# Patient Record
Sex: Female | Born: 1975 | Race: White | Hispanic: No | Marital: Married | Smoking: Former smoker
Health system: Southern US, Community
[De-identification: ages and names within clinical notes are randomized; demographics above are authoritative.]

## PROBLEM LIST (undated history)

## (undated) DIAGNOSIS — R519 Headache, unspecified: Secondary | ICD-10-CM

## (undated) DIAGNOSIS — R51 Headache: Secondary | ICD-10-CM

## (undated) HISTORY — DX: Headache: R51

## (undated) HISTORY — DX: Headache, unspecified: R51.9

---

## 2011-05-30 LAB — OB RESULTS CONSOLE RPR: RPR: NONREACTIVE

## 2011-05-30 LAB — OB RESULTS CONSOLE HEPATITIS B SURFACE ANTIGEN: Hepatitis B Surface Ag: NEGATIVE

## 2011-05-30 LAB — OB RESULTS CONSOLE RUBELLA ANTIBODY, IGM: Rubella: IMMUNE

## 2011-05-30 LAB — OB RESULTS CONSOLE HIV ANTIBODY (ROUTINE TESTING): HIV: NONREACTIVE

## 2011-12-18 ENCOUNTER — Encounter (HOSPITAL_COMMUNITY): Payer: Self-pay | Admitting: Pharmacy Technician

## 2011-12-19 ENCOUNTER — Encounter (HOSPITAL_COMMUNITY): Payer: Self-pay

## 2011-12-19 ENCOUNTER — Encounter (HOSPITAL_COMMUNITY)
Admission: RE | Admit: 2011-12-19 | Discharge: 2011-12-19 | Disposition: A | Discharge status: Home / Self Care | Payer: Managed Care, Other (non HMO) | Source: Ambulatory Visit | Attending: Obstetrics and Gynecology | Admitting: Obstetrics and Gynecology

## 2011-12-19 LAB — SURGICAL PCR SCREEN
MRSA, PCR: NEGATIVE
Staphylococcus aureus: POSITIVE — AB

## 2011-12-19 LAB — CBC
MCV: 89 fL (ref 78.0–100.0)
Platelets: 125 10*3/uL — ABNORMAL LOW (ref 150–400)
RDW: 14.1 % (ref 11.5–15.5)
WBC: 7.6 10*3/uL (ref 4.0–10.5)

## 2011-12-19 NOTE — Pre-Procedure Instructions (Signed)
Platelet results given to Dr. Cristela Blue, to be repeated on day of C/S per his order.

## 2011-12-19 NOTE — Patient Instructions (Addendum)
[address]  12/19/2011   Your procedure is scheduled on:  12/26/11  Enter through the Main Entrance of [facility] at 6 AM.  Pick up the phone at the desk and dial [phone].   Call this number if you have problems the morning of surgery: [phone]   Remember:   Do not eat food:After Midnight.  Do not drink clear liquids: After Midnight.  Take these medicines the morning of surgery with A SIP OF WATER: NA   Do not wear jewelry, make-up or nail polish.  Do not wear lotions, powders, or perfumes. You may wear deodorant.  Do not shave 48 hours prior to surgery.  Do not bring valuables to the hospital.  Contacts, dentures or bridgework may not be worn into surgery.  Leave suitcase in the car. After surgery it may be brought to your room.  For patients admitted to the hospital, checkout time is 11:00 AM the day of discharge.   Patients discharged the day of surgery will not be allowed to drive home.  Name and phone number of your driver: NA  Special Instructions: CHG Shower Use Special Wash: 1/2 bottle night before surgery and 1/2 bottle morning of surgery.   Please read over the following fact sheets that you were given: MRSA Information

## 2011-12-19 NOTE — H&P (Addendum)
36 yo G3P2 presents for repeat c-section  PMHx: negative PSHx: c-section x 2 All:  None Meds:  PNV Fhx:  N/c SHx:  negative  Af, vss Gen - NAD ABd - gravid, NT CV - RRR LUngs - clear Ext - NT  A/P:  Previous c-section x 2 , for repeat Plan of care discussed, informed consent obtained.

## 2011-12-26 ENCOUNTER — Encounter (HOSPITAL_COMMUNITY)
Admission: RE | Disposition: A | Discharge status: Home / Self Care | Payer: Self-pay | Source: Ambulatory Visit | Attending: Obstetrics and Gynecology

## 2011-12-26 ENCOUNTER — Inpatient Hospital Stay (HOSPITAL_COMMUNITY)
Admission: RE | Admit: 2011-12-26 | Discharge: 2011-12-28 | DRG: 766 | Disposition: A | Discharge status: Home / Self Care | Payer: Managed Care, Other (non HMO) | Source: Ambulatory Visit | Attending: Obstetrics and Gynecology | Admitting: Obstetrics and Gynecology

## 2011-12-26 ENCOUNTER — Encounter (HOSPITAL_COMMUNITY): Payer: Self-pay

## 2011-12-26 ENCOUNTER — Encounter (HOSPITAL_COMMUNITY): Payer: Self-pay | Admitting: Anesthesiology

## 2011-12-26 ENCOUNTER — Inpatient Hospital Stay (HOSPITAL_COMMUNITY): Payer: Managed Care, Other (non HMO) | Admitting: Anesthesiology

## 2011-12-26 DIAGNOSIS — I498 Other specified cardiac arrhythmias: Secondary | ICD-10-CM | POA: Diagnosis not present

## 2011-12-26 DIAGNOSIS — O99893 Other specified diseases and conditions complicating puerperium: Secondary | ICD-10-CM | POA: Diagnosis not present

## 2011-12-26 DIAGNOSIS — O34219 Maternal care for unspecified type scar from previous cesarean delivery: Principal | ICD-10-CM | POA: Diagnosis present

## 2011-12-26 LAB — PREPARE RBC (CROSSMATCH)

## 2011-12-26 LAB — PLATELET COUNT: Platelets: 134 10*3/uL — ABNORMAL LOW (ref 150–400)

## 2011-12-26 LAB — ABO/RH: ABO/RH(D): O POS

## 2011-12-26 SURGERY — Surgical Case
Anesthesia: Spinal | Site: Abdomen | Wound class: Clean Contaminated

## 2011-12-26 MED ORDER — ONDANSETRON HCL 4 MG/2ML IJ SOLN
INTRAMUSCULAR | Status: DC | PRN
  Administered 2011-12-26: 4 mg via INTRAVENOUS

## 2011-12-26 MED ORDER — MORPHINE SULFATE (PF) 0.5 MG/ML IJ SOLN
INTRAMUSCULAR | Status: DC | PRN
  Administered 2011-12-26: .1 mg via INTRATHECAL

## 2011-12-26 MED ORDER — PHENYLEPHRINE HCL 10 MG/ML IJ SOLN
INTRAMUSCULAR | Status: DC | PRN
  Administered 2011-12-26 (×6): 40 ug via INTRAVENOUS

## 2011-12-26 MED ORDER — IBUPROFEN 600 MG PO TABS
600.0000 mg | ORAL_TABLET | Freq: Four times a day (QID) | ORAL | Status: DC
  Administered 2011-12-26 – 2011-12-28 (×8): 600 mg via ORAL
  Filled 2011-12-26 (×8): qty 1

## 2011-12-26 MED ORDER — MEPERIDINE HCL 25 MG/ML IJ SOLN: 6.2500 mg | INTRAMUSCULAR | Status: DC | PRN

## 2011-12-26 MED ORDER — NALBUPHINE HCL 10 MG/ML IJ SOLN
5.0000 mg | INTRAMUSCULAR | Status: DC | PRN
  Filled 2011-12-26: qty 1

## 2011-12-26 MED ORDER — DIPHENHYDRAMINE HCL 25 MG PO CAPS: 25.0000 mg | ORAL_CAPSULE | Freq: Four times a day (QID) | ORAL | Status: DC | PRN

## 2011-12-26 MED ORDER — DIPHENHYDRAMINE HCL 25 MG PO CAPS
25.0000 mg | ORAL_CAPSULE | ORAL | Status: DC | PRN
  Administered 2011-12-26: 25 mg via ORAL
  Filled 2011-12-26 (×2): qty 1

## 2011-12-26 MED ORDER — NALBUPHINE HCL 10 MG/ML IJ SOLN
5.0000 mg | INTRAMUSCULAR | Status: DC | PRN
Start: 2011-12-26 — End: 2011-12-28
  Filled 2011-12-26: qty 1

## 2011-12-26 MED ORDER — OXYTOCIN 10 UNIT/ML IJ SOLN
INTRAMUSCULAR | Status: AC
  Filled 2011-12-26: qty 4

## 2011-12-26 MED ORDER — NALOXONE HCL 0.4 MG/ML IJ SOLN
1.0000 ug/kg/h | INTRAMUSCULAR | Status: DC | PRN
  Filled 2011-12-26: qty 2.5

## 2011-12-26 MED ORDER — SCOPOLAMINE 1 MG/3DAYS TD PT72: 1.0000 | MEDICATED_PATCH | Freq: Once | TRANSDERMAL | Status: DC

## 2011-12-26 MED ORDER — SODIUM CHLORIDE 0.9 % IJ SOLN: 3.0000 mL | INTRAMUSCULAR | Status: DC | PRN

## 2011-12-26 MED ORDER — ONDANSETRON HCL 4 MG PO TABS: 4.0000 mg | ORAL_TABLET | ORAL | Status: DC | PRN

## 2011-12-26 MED ORDER — KETOROLAC TROMETHAMINE 60 MG/2ML IM SOLN
INTRAMUSCULAR | Status: AC
  Filled 2011-12-26: qty 2

## 2011-12-26 MED ORDER — CEFAZOLIN SODIUM-DEXTROSE 2-3 GM-% IV SOLR: 2.0000 g | INTRAVENOUS | Status: DC

## 2011-12-26 MED ORDER — LIDOCAINE IN DEXTROSE 5-7.5 % IV SOLN
INTRAVENOUS | Status: DC | PRN
  Administered 2011-12-26: 70 mg via INTRATHECAL

## 2011-12-26 MED ORDER — PROMETHAZINE HCL 25 MG/ML IJ SOLN: 6.2500 mg | INTRAMUSCULAR | Status: DC | PRN

## 2011-12-26 MED ORDER — MEASLES, MUMPS & RUBELLA VAC ~~LOC~~ INJ
0.5000 mL | INJECTION | Freq: Once | SUBCUTANEOUS | Status: DC
  Filled 2011-12-26: qty 0.5

## 2011-12-26 MED ORDER — SIMETHICONE 80 MG PO CHEW
80.0000 mg | CHEWABLE_TABLET | Freq: Three times a day (TID) | ORAL | Status: DC
  Administered 2011-12-26 – 2011-12-28 (×8): 80 mg via ORAL

## 2011-12-26 MED ORDER — DIBUCAINE 1 % RE OINT: 1.0000 "application " | TOPICAL_OINTMENT | RECTAL | Status: DC | PRN

## 2011-12-26 MED ORDER — FENTANYL CITRATE 0.05 MG/ML IJ SOLN
INTRAMUSCULAR | Status: AC
  Filled 2011-12-26: qty 2

## 2011-12-26 MED ORDER — KETOROLAC TROMETHAMINE 30 MG/ML IJ SOLN: 30.0000 mg | Freq: Four times a day (QID) | INTRAMUSCULAR | Status: AC | PRN

## 2011-12-26 MED ORDER — SCOPOLAMINE 1 MG/3DAYS TD PT72
1.0000 | MEDICATED_PATCH | Freq: Once | TRANSDERMAL | Status: DC
  Administered 2011-12-26: 1.5 mg via TRANSDERMAL

## 2011-12-26 MED ORDER — SCOPOLAMINE 1 MG/3DAYS TD PT72
MEDICATED_PATCH | TRANSDERMAL | Status: AC
  Administered 2011-12-26: 1.5 mg via TRANSDERMAL
  Filled 2011-12-26: qty 1

## 2011-12-26 MED ORDER — DIPHENHYDRAMINE HCL 50 MG/ML IJ SOLN: 12.5000 mg | INTRAMUSCULAR | Status: DC | PRN

## 2011-12-26 MED ORDER — LANOLIN HYDROUS EX OINT: 1.0000 "application " | TOPICAL_OINTMENT | CUTANEOUS | Status: DC | PRN

## 2011-12-26 MED ORDER — METOCLOPRAMIDE HCL 5 MG/ML IJ SOLN: 10.0000 mg | Freq: Three times a day (TID) | INTRAMUSCULAR | Status: DC | PRN

## 2011-12-26 MED ORDER — MEDROXYPROGESTERONE ACETATE 150 MG/ML IM SUSP: 150.0000 mg | INTRAMUSCULAR | Status: DC | PRN

## 2011-12-26 MED ORDER — KETOROLAC TROMETHAMINE 30 MG/ML IJ SOLN: 15.0000 mg | Freq: Once | INTRAMUSCULAR | Status: DC | PRN

## 2011-12-26 MED ORDER — CEFAZOLIN SODIUM-DEXTROSE 2-3 GM-% IV SOLR
INTRAVENOUS | Status: AC
  Administered 2011-12-26: 2 g via INTRAVENOUS
  Filled 2011-12-26: qty 50

## 2011-12-26 MED ORDER — DIPHENHYDRAMINE HCL 50 MG/ML IJ SOLN: 25.0000 mg | INTRAMUSCULAR | Status: DC | PRN

## 2011-12-26 MED ORDER — MENTHOL 3 MG MT LOZG: 1.0000 | LOZENGE | OROMUCOSAL | Status: DC | PRN

## 2011-12-26 MED ORDER — HYDROMORPHONE HCL PF 1 MG/ML IJ SOLN: 0.2500 mg | INTRAMUSCULAR | Status: DC | PRN

## 2011-12-26 MED ORDER — NALOXONE HCL 0.4 MG/ML IJ SOLN: 0.4000 mg | INTRAMUSCULAR | Status: DC | PRN

## 2011-12-26 MED ORDER — ONDANSETRON HCL 4 MG/2ML IJ SOLN: 4.0000 mg | Freq: Three times a day (TID) | INTRAMUSCULAR | Status: DC | PRN

## 2011-12-26 MED ORDER — PHENYLEPHRINE 40 MCG/ML (10ML) SYRINGE FOR IV PUSH (FOR BLOOD PRESSURE SUPPORT)
PREFILLED_SYRINGE | INTRAVENOUS | Status: AC
  Filled 2011-12-26: qty 5

## 2011-12-26 MED ORDER — WITCH HAZEL-GLYCERIN EX PADS: 1.0000 "application " | MEDICATED_PAD | CUTANEOUS | Status: DC | PRN

## 2011-12-26 MED ORDER — LACTATED RINGERS IV SOLN
INTRAVENOUS | Status: DC
  Administered 2011-12-26 (×3): via INTRAVENOUS

## 2011-12-26 MED ORDER — TETANUS-DIPHTH-ACELL PERTUSSIS 5-2.5-18.5 LF-MCG/0.5 IM SUSP: 0.5000 mL | Freq: Once | INTRAMUSCULAR | Status: DC

## 2011-12-26 MED ORDER — PRENATAL MULTIVITAMIN CH
1.0000 | ORAL_TABLET | Freq: Every day | ORAL | Status: DC
  Administered 2011-12-27 – 2011-12-28 (×2): 1 via ORAL
  Filled 2011-12-26 (×2): qty 1

## 2011-12-26 MED ORDER — ONDANSETRON HCL 4 MG/2ML IJ SOLN: 4.0000 mg | INTRAMUSCULAR | Status: DC | PRN

## 2011-12-26 MED ORDER — OXYTOCIN 10 UNIT/ML IJ SOLN
40.0000 [IU] | INTRAVENOUS | Status: DC | PRN
  Administered 2011-12-26: 40 [IU] via INTRAVENOUS

## 2011-12-26 MED ORDER — FENTANYL CITRATE 0.05 MG/ML IJ SOLN
INTRAMUSCULAR | Status: DC | PRN
  Administered 2011-12-26: 15 ug via INTRATHECAL

## 2011-12-26 MED ORDER — DEXTROSE IN LACTATED RINGERS 5 % IV SOLN
INTRAVENOUS | Status: DC
  Administered 2011-12-26: 17:00:00 via INTRAVENOUS

## 2011-12-26 MED ORDER — SENNOSIDES-DOCUSATE SODIUM 8.6-50 MG PO TABS
2.0000 | ORAL_TABLET | Freq: Every day | ORAL | Status: DC
  Administered 2011-12-26 – 2011-12-27 (×2): 2 via ORAL

## 2011-12-26 MED ORDER — OXYTOCIN 40 UNITS IN LACTATED RINGERS INFUSION - SIMPLE MED: 62.5000 mL/h | INTRAVENOUS | Status: AC

## 2011-12-26 MED ORDER — MORPHINE SULFATE 0.5 MG/ML IJ SOLN
INTRAMUSCULAR | Status: AC
  Filled 2011-12-26: qty 10

## 2011-12-26 MED ORDER — ONDANSETRON HCL 4 MG/2ML IJ SOLN
INTRAMUSCULAR | Status: AC
  Filled 2011-12-26: qty 2

## 2011-12-26 MED ORDER — OXYCODONE-ACETAMINOPHEN 5-325 MG PO TABS
1.0000 | ORAL_TABLET | ORAL | Status: DC | PRN
  Administered 2011-12-27 – 2011-12-28 (×4): 1 via ORAL
  Filled 2011-12-26 (×4): qty 1

## 2011-12-26 MED ORDER — SIMETHICONE 80 MG PO CHEW: 80.0000 mg | CHEWABLE_TABLET | ORAL | Status: DC | PRN

## 2011-12-26 MED ORDER — KETOROLAC TROMETHAMINE 60 MG/2ML IM SOLN
60.0000 mg | Freq: Once | INTRAMUSCULAR | Status: AC | PRN
  Administered 2011-12-26: 60 mg via INTRAMUSCULAR

## 2011-12-26 SURGICAL SUPPLY — 29 items
CHLORAPREP W/TINT 26ML (MISCELLANEOUS) ×2 IMPLANT
CLOTH BEACON ORANGE TIMEOUT ST (SAFETY) ×2 IMPLANT
DERMABOND ADVANCED (GAUZE/BANDAGES/DRESSINGS) ×1
DERMABOND ADVANCED .7 DNX12 (GAUZE/BANDAGES/DRESSINGS) ×1 IMPLANT
DRSG COVADERM 4X10 (GAUZE/BANDAGES/DRESSINGS) ×2 IMPLANT
ELECT REM PT RETURN 9FT ADLT (ELECTROSURGICAL) ×2
ELECTRODE REM PT RTRN 9FT ADLT (ELECTROSURGICAL) ×1 IMPLANT
EXTRACTOR VACUUM M CUP 4 TUBE (SUCTIONS) IMPLANT
GLOVE BIO SURGEON STRL SZ 6.5 (GLOVE) ×2 IMPLANT
GLOVE BIOGEL PI IND STRL 7.0 (GLOVE) ×2 IMPLANT
GLOVE BIOGEL PI INDICATOR 7.0 (GLOVE) ×2
GOWN PREVENTION PLUS LG XLONG (DISPOSABLE) ×6 IMPLANT
GOWN STRL REIN XL XLG (GOWN DISPOSABLE) ×2 IMPLANT
KIT ABG SYR 3ML LUER SLIP (SYRINGE) IMPLANT
NEEDLE HYPO 25X5/8 SAFETYGLIDE (NEEDLE) ×2 IMPLANT
NS IRRIG 1000ML POUR BTL (IV SOLUTION) ×2 IMPLANT
PACK C SECTION WH (CUSTOM PROCEDURE TRAY) ×2 IMPLANT
PAD OB MATERNITY 4.3X12.25 (PERSONAL CARE ITEMS) ×2 IMPLANT
SLEEVE SCD COMPRESS KNEE MED (MISCELLANEOUS) IMPLANT
STAPLER VISISTAT 35W (STAPLE) ×2 IMPLANT
SUT CHROMIC 0 CT 802H (SUTURE) IMPLANT
SUT CHROMIC 0 CTX 36 (SUTURE) ×6 IMPLANT
SUT MON AB-0 CT1 36 (SUTURE) ×2 IMPLANT
SUT PDS AB 0 CTX 60 (SUTURE) ×2 IMPLANT
SUT PLAIN 0 NONE (SUTURE) ×4 IMPLANT
SUT VIC AB 4-0 KS 27 (SUTURE) ×2 IMPLANT
TOWEL OR 17X24 6PK STRL BLUE (TOWEL DISPOSABLE) ×4 IMPLANT
TRAY FOLEY CATH 14FR (SET/KITS/TRAYS/PACK) ×2 IMPLANT
WATER STERILE IRR 1000ML POUR (IV SOLUTION) ×2 IMPLANT

## 2011-12-26 NOTE — Anesthesia Postprocedure Evaluation (Signed)
Anesthesia Post Note  Patient: Deborah Mitchell  Procedure(s) Performed: Procedure(s) (LRB): CESAREAN SECTION (N/A)  Anesthesia type: Spinal  Patient location: PACU  Post pain: Pain level controlled  Post assessment: Post-op Vital signs reviewed  Last Vitals:  Filed Vitals:   12/26/11 0610  BP: 118/81  Pulse: 73  Temp: 36.7 C  Resp: 16    Post vital signs: Reviewed  Level of consciousness: awake  Complications: No apparent anesthesia complications

## 2011-12-26 NOTE — Progress Notes (Signed)
Pt with bradycardia in the 40s while in pacu, other vss, Dr Rodman Pickle made aware , okay to transfer pt to the floor hr will increase as epidural wears off, pt asymptomatic with bradycardia.

## 2011-12-26 NOTE — Anesthesia Postprocedure Evaluation (Signed)
  Anesthesia Post-op Note  Patient: Deborah Mitchell  Procedure(s) Performed: Procedure(s) (LRB): CESAREAN SECTION (N/A)  Patient Location: PACU and Women's Unit  Anesthesia Type: Spinal  Level of Consciousness: awake, alert  and oriented  Airway and Oxygen Therapy: Patient Spontanous Breathing  Post-op Pain: mild  Post-op Assessment: Patient's Cardiovascular Status Stable, Respiratory Function Stable, No signs of Nausea or vomiting and Pain level controlled  Post-op Vital Signs: stable  Complications: No apparent anesthesia complications

## 2011-12-26 NOTE — Progress Notes (Signed)
Pt arrived to unit at 9:30am with 2 small areas of shadowing on [place] dressing.  At 10:30 island dressing saturated with red blood, not wet to touch, but covered under top layer of material.  Applied ABD pad with hypofix as pressure dressing.  At 12 noon, left half of dressing saturated with red blood with wet area in bottom left corner of hypofix.  VSS.  Pt denies pain.  Moderate amt lochia.  Fundus firm, ML, @U .  Phoned Dr. Renaldo Fiddler with above information.  She instructed RN to reinforce area again and that she would come by soon to check incision.

## 2011-12-26 NOTE — Transfer of Care (Signed)
Immediate Anesthesia Transfer of Care Note  Patient: Deborah Mitchell  Procedure(s) Performed: Procedure(s) (LRB): CESAREAN SECTION (N/A)  Patient Location: PACU  Anesthesia Type: Spinal  Level of Consciousness: awake, alert  and oriented  Airway & Oxygen Therapy: Patient Spontanous Breathing  Post-op Assessment: Report given to PACU RN and Post -op Vital signs reviewed and stable  Post vital signs: stable  Complications: No apparent anesthesia complications

## 2011-12-26 NOTE — Anesthesia Procedure Notes (Signed)
Spinal  Patient location during procedure: OR Start time: 12/26/2011 7:20 AM Staffing Performed by: anesthesiologist  Preanesthetic Checklist Completed: patient identified, site marked, surgical consent, pre-op evaluation, timeout performed, IV checked, risks and benefits discussed and monitors and equipment checked Spinal Block Patient position: sitting Prep: site prepped and draped and DuraPrep Patient monitoring: heart rate, cardiac monitor, continuous pulse ox and blood pressure Approach: midline Location: L3-4 Injection technique: single-shot Needle Needle type: Sprotte  Needle gauge: 24 G Needle length: 9 cm Assessment Sensory level: T4 Additional Notes Clear free flow CSF on first attempt.  No paresthesia.  Patient tolerated procedure well.  Jasmine December, MD

## 2011-12-26 NOTE — Addendum Note (Signed)
Addendum  created 12/26/11 1638 by Marvette Schamp S Sammie Schermerhorn, CRNA   Modules edited:Notes Section    

## 2011-12-26 NOTE — Anesthesia Preprocedure Evaluation (Signed)
Anesthesia Evaluation  Patient identified by MRN, date of birth, ID band Patient awake    Reviewed: Allergy & Precautions, H&P , NPO status , Patient's Chart, lab work & pertinent test results  Airway Mallampati: I TM Distance: >3 FB Neck ROM: full    Dental No notable dental hx.    Pulmonary neg pulmonary ROS,  breath sounds clear to auscultation  Pulmonary exam normal       Cardiovascular negative cardio ROS      Neuro/Psych negative neurological ROS  negative psych ROS   GI/Hepatic negative GI ROS, Neg liver ROS,   Endo/Other  negative endocrine ROS  Renal/GU negative Renal ROS  negative genitourinary   Musculoskeletal negative musculoskeletal ROS (+)   Abdominal Normal abdominal exam  (+)   Peds negative pediatric ROS (+)  Hematology negative hematology ROS (+)   Anesthesia Other Findings   Reproductive/Obstetrics (+) Pregnancy                           Anesthesia Physical Anesthesia Plan  ASA: II  Anesthesia Plan: Spinal   Post-op Pain Management:    Induction:   Airway Management Planned:   Additional Equipment:   Intra-op Plan:   Post-operative Plan:   Informed Consent: I have reviewed the patients History and Physical, chart, labs and discussed the procedure including the risks, benefits and alternatives for the proposed anesthesia with the patient or authorized representative who has indicated his/her understanding and acceptance.     Plan Discussed with: CRNA and Surgeon  Anesthesia Plan Comments:         Anesthesia Quick Evaluation

## 2011-12-26 NOTE — Progress Notes (Signed)
CTSP and evaluate incision/dressing.  Pt has had 2 dressing changes b/c of saturation w/in 1-1.5hrs.  Denies pain, lightheadedness, dizziness.    Upon my eval, dressing is dry (changed 1 hr ago) without shadowing/drainage.  Because of this, decided to leave bandage in place and watch for drainage/saturation.

## 2011-12-27 ENCOUNTER — Encounter (HOSPITAL_COMMUNITY): Payer: Self-pay | Admitting: Obstetrics and Gynecology

## 2011-12-27 LAB — CBC
HCT: 30.6 % — ABNORMAL LOW (ref 36.0–46.0)
MCH: 29.7 pg (ref 26.0–34.0)
MCV: 90 fL (ref 78.0–100.0)
Platelets: 132 10*3/uL — ABNORMAL LOW (ref 150–400)
RBC: 3.4 MIL/uL — ABNORMAL LOW (ref 3.87–5.11)
WBC: 8.6 10*3/uL (ref 4.0–10.5)

## 2011-12-27 LAB — TYPE AND SCREEN: Unit division: 0

## 2011-12-27 NOTE — Progress Notes (Signed)
Ur chart review completed.  

## 2011-12-27 NOTE — Progress Notes (Signed)
Subjective: Postpartum Day 1: Cesarean Delivery Patient reports tolerating PO and no problems voiding.  Baby in NICU  Objective: Vital signs in last 24 hours: Temp:  [97.2 F (36.2 C)-98.4 F (36.9 C)] 98.2 F (36.8 C) (08/21 0555) Pulse Rate:  [43-80] 55  (08/21 0555) Resp:  [14-20] 18  (08/21 0555) BP: (98-124)/(62-74) 108/73 mmHg (08/21 0555) SpO2:  [96 %-100 %] 99 % (08/21 0555) Weight:  [63.504 kg (140 lb)] 63.504 kg (140 lb) (08/20 0845)  Physical Exam:  General: alert and cooperative Lochia: appropriate Uterine Fundus: firm Incision: healing well, dressing removed which was saturated. No active bleeding observed DVT Evaluation: No evidence of DVT seen on physical exam.   Basename 12/27/11 0615  HGB 10.1*  HCT 30.6*    Assessment/Plan: Status post Cesarean section. Doing well postoperatively.  Continue current care.  Ronnika Collett G 12/27/2011, 8:18 AM

## 2011-12-27 NOTE — Op Note (Signed)
Deborah Mitchell, Deborah Mitchell                ACCOUNT NO.:  [id number]  MEDICAL RECORD NO.:  [id number]  LOCATION:  9303                          FACILITY:  [facility]  PHYSICIAN:  Zelphia Cairo, MD    DATE OF BIRTH:  07-14-1975  DATE OF PROCEDURE: DATE OF DISCHARGE:                              OPERATIVE REPORT   PREOPERATIVE DIAGNOSIS: 1. Intrauterine pregnancy at 39+ 1 week. 2. Prior cesarean section x2.  PROCEDURE:  Repeat low-transverse cesarean delivery.  SURGEON:  Zelphia Cairo, MD  BLOOD LOSS:  500 mL.  URINE OUTPUT:  Clear.  ANESTHESIA:  Spinal.  FINDINGS:  Viable female infant.  Normal-appearing pelvic anatomy.  COMPLICATIONS:  None.  CONDITION:  Stable to recovery room.  PROCEDURE:  The patient was taken to the operating room after informed consent was obtained.  She was prepped and draped in sterile fashion and a Foley catheter was inserted sterilely.  Pfannenstiel skin incision was made with a scalpel and extended laterally.  The fascia was incised in the midline and extended with curved Mayo scissors.  Kocher clamps were used to grasp the superior and inferior portion of the fascia.  The fascia was tented upwards and underlying rectus muscles were dissected off using sharp and blunt dissection.  Peritoneum was then identified and tented upwards with 2 hemostats.  The peritoneum was entered sharply with Metzenbaum scissors.  This was extended superiorly and inferiorly with good visualization of the bladder.  Bladder blade was inserted. The bladder flap was created using sharp and blunt dissection and the bladder blade was repositioned.  Uterine incision was made with a scalpel and extended bluntly using my fingers.  Membranes were ruptured for clear fluid.  Fetal vertex was brought to the uterine incision.  The mouth and nose were suctioned. The shoulders and body easily followed with fundal pressure.  The cord was clamped and cut as the infant's mouth and nose were  suctioned and the infant was taken to the awaiting pediatric staff.  The placenta was then manually removed from the uterus.  The uterus was cleared of all clots and debris using a dry lap sponge.  The uterine incision was closed using double-layer closure of 0 chromic.  Once hemostasis was assured, the pelvis was copiously irrigated with saline.  The incision was reinspected and again found to be hemostatic.  The peritoneum was closed with 0 Monocryl.  The fascia was closed with a looped 0 PDS and the skin was initially closed with 4-0 Vicryl. however, due to some puckering.  The sutures were removed and staples were placed.  The patient was taken to the recovery room in stable condition.  Sponge, lap, needle, and instrument counts were correct x2.     Zelphia Cairo, MD     GA/MEDQ  D:  12/26/2011  T:  12/27/2011  Job:  [id number]

## 2011-12-28 MED ORDER — IBUPROFEN 600 MG PO TABS: 600.0000 mg | ORAL_TABLET | Freq: Four times a day (QID) | ORAL | Status: AC

## 2011-12-28 MED ORDER — OXYCODONE-ACETAMINOPHEN 5-325 MG PO TABS: 1.0000 | ORAL_TABLET | ORAL | Status: AC | PRN

## 2011-12-28 NOTE — Progress Notes (Signed)
Pt to nicu for d/c of infant  Teaching complete

## 2011-12-28 NOTE — Clinical Social Work Maternal (Signed)
    LATE ENTRY FROM 12/27/11:  Clinical Social Work Department PSYCHOSOCIAL ASSESSMENT - MATERNAL/CHILD 12/28/2011  Patient:  OZA, OBERLE  Account Number:  [id number]  Admit Date:  12/26/2011  Marjo Bicker Name:   Gavin Pound    Clinical Social Worker:  Andy Gauss   Date/Time:  12/27/2011 01:00 PM  Date Referred:  12/27/2011   Referral source  NICU  NICU     Referred reason  NICU   Other referral source:    I:  FAMILY / HOME ENVIRONMENT Child's legal guardian:  PARENT  Guardian - Name Guardian - Age Guardian - Address  Solis Mangine [address] [place]; [place], [place] [address]  Eustace Pen 34 (same as above)   Other household support members/support persons Name Relationship DOB  [place] DAUGHTER 34yo  Debbe Mounts [phone]   Other support:    II  PSYCHOSOCIAL DATA Information Source:  Patient Interview  Event organiser Employment:   Surveyor, quantity resources:  Media planner If [employer] - [place]:    School / Grade:   Maternity Care Coordinator / Child Services Coordination / Early Interventions:  Cultural issues impacting care:    III  STRENGTHS Strengths  Adequate Resources  Home prepared for Child (including basic supplies)  Supportive family/friends   Strength comment:    IV  RISK FACTORS AND CURRENT PROBLEMS Current Problem:  YES   Risk Factor & Current Problem Patient Issue Family Issue Risk Factor / Current Problem Comment   N N     V  SOCIAL WORK ASSESSMENT Sw met with MOB briefly to offer resources and assess social situation.  MOB appears to be doing well and understanding of NICU admission.  She is hopeful that the NICU admission will be brief.  Pt lives with her spouse and their 2 young daughters.  She has selected Dr. Jenne Pane as the infants pediatrician.  Pt has all the necessary supplies for the infant and appropriate supports in place.  She does not express any needs and this time.  Sw will continue to follow and act  as a resource until infant is discharged.      VI SOCIAL WORK PLAN Social Work Plan  No Further Intervention Required / No Barriers to Discharge   Type of pt/family education:   If child protective services report - county:   If child protective services report - date:   Information/referral to community resources comment:   Other social work plan:

## 2011-12-28 NOTE — Discharge Summary (Signed)
Obstetric Discharge Summary Reason for Admission: cesarean section Prenatal Procedures: ultrasound Intrapartum Procedures: cesarean: low cervical, transverse Postpartum Procedures: none Complications-Operative and Postpartum: none Hemoglobin  Date Value Range Status  12/27/2011 10.1* 12.0 - 15.0 g/dL Final     HCT  Date Value Range Status  12/27/2011 30.6* 36.0 - 46.0 % Final    Physical Exam:  General: alert and cooperative Lochia: appropriate Uterine Fundus: firm Incision: healing well, small ecchymosis noted L margin of incision with small drainage. No evidence of hematoma or seroma, staples left in place, steri-strips applied DVT Evaluation: No evidence of DVT seen on physical exam.  Discharge Diagnoses: Term Pregnancy-delivered  Discharge Information: Date: 12/28/2011 Activity: pelvic rest Diet: routine Medications: PNV, Ibuprofen and Percocet Condition: stable Instructions: refer to practice specific booklet Discharge to: home   Newborn Data: Live born female  Birth Weight: 7 lb 10.1 oz (3460 g) APGAR: 8, 8  Home with NICU with possible discharge home today.  Kiersten Coss G 12/28/2011, 9:23 AM

## 2011-12-28 NOTE — Progress Notes (Signed)
This was a brief visit with Deborah Mitchell and her husband who was pleased to be discharged today along with her baby.  They did not wish to visit further at this time, but seemed to be doing well.    [address] [place] Pager, [phone] 10:42 AM   12/28/11 1000  Clinical Encounter Type  Visited With Patient and family together  Visit Type Initial

## 2011-12-28 NOTE — Progress Notes (Signed)
UR Chart review completed.  

## 2014-03-09 ENCOUNTER — Encounter (HOSPITAL_COMMUNITY): Payer: Self-pay | Admitting: Obstetrics and Gynecology

## 2017-05-21 ENCOUNTER — Encounter: Payer: Self-pay | Admitting: Neurology

## 2017-05-21 ENCOUNTER — Ambulatory Visit (INDEPENDENT_AMBULATORY_CARE_PROVIDER_SITE_OTHER): Payer: BLUE CROSS/BLUE SHIELD | Admitting: Neurology

## 2017-05-21 ENCOUNTER — Encounter (INDEPENDENT_AMBULATORY_CARE_PROVIDER_SITE_OTHER): Payer: Self-pay

## 2017-05-21 VITALS — BP 138/96 | HR 87 | Ht 64.0 in | Wt 126.0 lb

## 2017-05-21 DIAGNOSIS — G4452 New daily persistent headache (NDPH): Secondary | ICD-10-CM

## 2017-05-21 NOTE — Patient Instructions (Signed)
Magnesium oxide 400 mg twice a day Riboflavin= vitamin B12 100 mg twice a day

## 2017-05-21 NOTE — Progress Notes (Signed)
PATIENT: Deborah Mitchell DOB: 08/08/1975  Chief Complaint  Patient presents with  . Headache    Reports having daily headaches since June 2018.  She is using a magnesium supplement but no prescribed medications for her headaches.  She does not medicate the pain.  Marland Kitchen. PCP    Puglisi, Pamala DuffelJanis P, FNP     HISTORICAL  Deborah Mitchell is a 42 year old female, seen in refer by her primary care nurse practitioner Avel SensorPuglisi, Janis P, for evaluation of frequent headache, initial evaluation was on May 21, 2017.  I reviewed and summarized the referring note, she was previously healthy, deny a family history of previous history of migraine headache,   Since June 2018, she began to complains of frequent headaches, left frontal temporal region mild tension headache, she only occasionally take Advil, but usually do not require medication, she denies pounding, no visual loss, no lateralized motor or sensory deficit.  She has occasionally transient sharp pain through the left temporoparietal region,   She complains of almost constant left-sided discomfort, pressure sensation, was seen by ophthalmologist recently that was normal   REVIEW OF SYSTEMS: Full 14 system review of systems performed and notable only for eye pain, persistent headaches  ALLERGIES: No Known Allergies  HOME MEDICATIONS: Current Outpatient Medications  Medication Sig Dispense Refill  . COLLAGEN PO Take 6,000 mg by mouth daily.    . Multiple Vitamins-Minerals (WOMENS MULTIVITAMIN PO) Take 1 tablet by mouth 2 (two) times daily.    . Norethindrone-Ethinyl Estradiol-Fe Biphas (LO LOESTRIN FE) 1 MG-10 MCG / 10 MCG tablet Take 1 tablet by mouth daily.    Marland Kitchen. OLIVE LEAF PO Take by mouth daily. Takes 1-2 tablets daily.    Marland Kitchen. UNABLE TO FIND Take 2 tablets by mouth daily. Cal (1000mg )-Mag (500mg )-Zinc (25mg ) with Vitamin D3 (200 IU)     No current facility-administered medications for this visit.     PAST MEDICAL HISTORY: Past Medical  History:  Diagnosis Date  . Headache     PAST SURGICAL HISTORY: Past Surgical History:  Procedure Laterality Date  . CESAREAN SECTION  2010,2011   x2  . CESAREAN SECTION  12/26/2011   Procedure: CESAREAN SECTION;  Surgeon: Zelphia CairoGretchen Adkins, MD;  Location: [facility];  Service: Obstetrics;  Laterality: N/A;  repeat edc 01/01/12    FAMILY HISTORY: Family History  Problem Relation Age of Onset  . Healthy Mother   . Hypertension Father   . Hyperlipidemia Father   . Alzheimer's disease Paternal Grandfather     SOCIAL HISTORY:  Social History   Socioeconomic History  . Marital status: Married    Spouse name: Not on file  . Number of children: 3  . Years of education: 16  . Highest education level: Master's degree (e.g., MA, MS, MEng, MEd, MSW, MBA)  Social Needs  . Financial resource strain: Not on file  . Food insecurity - worry: Not on file  . Food insecurity - inability: Not on file  . Transportation needs - medical: Not on file  . Transportation needs - non-medical: Not on file  Occupational History  . Occupation: Homemaker  Tobacco Use  . Smoking status: Former Smoker    Types: Cigarettes  . Smokeless tobacco: Never Used  Substance and Sexual Activity  . Alcohol use: Yes    Comment: Social  . Drug use: No  . Sexual activity: Not on file  Other Topics Concern  . Not on file  Social History Narrative   Lives at home with  husband and three children.   Left-handed.   0.5 cup caffeine daily.     PHYSICAL EXAM   Vitals:   05/21/17 1300  BP: (!) 138/96  Pulse: 87  Weight: 126 lb (57.2 kg)  Height: 5\' 4"  (1.626 m)    Not recorded      Body mass index is 21.63 kg/m.  PHYSICAL EXAMNIATION:  Gen: NAD, conversant, well nourised, obese, well groomed                     Cardiovascular: Regular rate rhythm, no peripheral edema, warm, nontender. Eyes: Conjunctivae clear without exudates or hemorrhage Neck: Supple, no carotid bruits. Pulmonary: Clear to  auscultation bilaterally   NEUROLOGICAL EXAM:  MENTAL STATUS: Speech:    Speech is normal; fluent and spontaneous with normal comprehension.  Cognition:     Orientation to time, place and person     Normal recent and remote memory     Normal Attention span and concentration     Normal Language, naming, repeating,spontaneous speech     Fund of knowledge   CRANIAL NERVES: CN II: Visual fields are full to confrontation. Fundoscopic exam is normal with sharp discs and no vascular changes. Pupils are round equal and briskly reactive to light. CN III, IV, VI: extraocular movement are normal. No ptosis. CN V: Facial sensation is intact to pinprick in all 3 divisions bilaterally. Corneal responses are intact.  CN VII: Face is symmetric with normal eye closure and smile. CN VIII: Hearing is normal to rubbing fingers CN IX, X: Palate elevates symmetrically. Phonation is normal. CN XI: Head turning and shoulder shrug are intact CN XII: Tongue is midline with normal movements and no atrophy.  MOTOR: There is no pronator drift of out-stretched arms. Muscle bulk and tone are normal. Muscle strength is normal.  REFLEXES: Reflexes are 2+ and symmetric at the biceps, triceps, knees, and ankles. Plantar responses are flexor.  SENSORY: Intact to light touch, pinprick, positional sensation and vibratory sensation are intact in fingers and toes.  COORDINATION: Rapid alternating movements and fine finger movements are intact. There is no dysmetria on finger-to-nose and heel-knee-shin.    GAIT/STANCE: Posture is normal. Gait is steady with normal steps, base, arm swing, and turning. Heel and toe walking are normal. Tandem gait is normal.  Romberg is absent.   DIAGNOSTIC DATA (LABS, IMAGING, TESTING) - I reviewed patient records, labs, notes, testing and imaging myself where available.   ASSESSMENT AND PLAN  Deborah Mitchell is a 42 y.o. female   Frequent left side headache  Has migraine  features  I have discussed with patient, continue observe her symptoms, if her symptoms persist, getting worse, we can proceed with MRI of the brain  NSAIDs as needed for moderate headaches for now   Levert Feinstein, M.D. Ph.D.  [facility] [address], [address] [place], [place] [address] Ph: [phone] Fax: [phone]  CC: Avel Sensor, FNP

## 2019-02-10 ENCOUNTER — Other Ambulatory Visit: Payer: Self-pay

## 2019-02-10 DIAGNOSIS — Z20822 Contact with and (suspected) exposure to covid-19: Secondary | ICD-10-CM

## 2019-02-12 LAB — NOVEL CORONAVIRUS, NAA: SARS-CoV-2, NAA: NOT DETECTED

## 2019-03-28 ENCOUNTER — Other Ambulatory Visit: Payer: Self-pay

## 2019-03-28 DIAGNOSIS — Z20822 Contact with and (suspected) exposure to covid-19: Secondary | ICD-10-CM

## 2019-03-31 LAB — NOVEL CORONAVIRUS, NAA: SARS-CoV-2, NAA: NOT DETECTED

## 2020-01-23 DIAGNOSIS — Z01419 Encounter for gynecological examination (general) (routine) without abnormal findings: Secondary | ICD-10-CM | POA: Diagnosis not present

## 2020-01-23 DIAGNOSIS — Z6821 Body mass index (BMI) 21.0-21.9, adult: Secondary | ICD-10-CM | POA: Diagnosis not present

## 2020-01-23 DIAGNOSIS — Z1231 Encounter for screening mammogram for malignant neoplasm of breast: Secondary | ICD-10-CM | POA: Diagnosis not present

## 2020-01-28 ENCOUNTER — Other Ambulatory Visit: Payer: Self-pay | Admitting: Obstetrics and Gynecology

## 2020-01-28 DIAGNOSIS — R928 Other abnormal and inconclusive findings on diagnostic imaging of breast: Secondary | ICD-10-CM

## 2020-01-30 DIAGNOSIS — Z1321 Encounter for screening for nutritional disorder: Secondary | ICD-10-CM | POA: Diagnosis not present

## 2020-01-30 DIAGNOSIS — Z1329 Encounter for screening for other suspected endocrine disorder: Secondary | ICD-10-CM | POA: Diagnosis not present

## 2020-01-30 DIAGNOSIS — Z13220 Encounter for screening for lipoid disorders: Secondary | ICD-10-CM | POA: Diagnosis not present

## 2020-01-30 DIAGNOSIS — Z131 Encounter for screening for diabetes mellitus: Secondary | ICD-10-CM | POA: Diagnosis not present

## 2020-02-11 ENCOUNTER — Other Ambulatory Visit: Payer: Self-pay | Admitting: Obstetrics and Gynecology

## 2020-02-11 ENCOUNTER — Ambulatory Visit
Admission: RE | Admit: 2020-02-11 | Discharge: 2020-02-11 | Disposition: A | Discharge status: Home / Self Care | Payer: BC Managed Care – PPO | Source: Ambulatory Visit | Attending: Obstetrics and Gynecology | Admitting: Obstetrics and Gynecology

## 2020-02-11 ENCOUNTER — Other Ambulatory Visit: Payer: Self-pay

## 2020-02-11 DIAGNOSIS — N6489 Other specified disorders of breast: Secondary | ICD-10-CM

## 2020-02-11 DIAGNOSIS — R928 Other abnormal and inconclusive findings on diagnostic imaging of breast: Secondary | ICD-10-CM

## 2020-03-18 DIAGNOSIS — Z23 Encounter for immunization: Secondary | ICD-10-CM | POA: Diagnosis not present

## 2020-04-27 DIAGNOSIS — Z20828 Contact with and (suspected) exposure to other viral communicable diseases: Secondary | ICD-10-CM | POA: Diagnosis not present

## 2020-08-12 ENCOUNTER — Other Ambulatory Visit: Payer: BC Managed Care – PPO

## 2020-08-27 ENCOUNTER — Ambulatory Visit
Admission: RE | Admit: 2020-08-27 | Discharge: 2020-08-27 | Disposition: A | Discharge status: Home / Self Care | Payer: BC Managed Care – PPO | Source: Ambulatory Visit | Attending: Obstetrics and Gynecology | Admitting: Obstetrics and Gynecology

## 2020-08-27 ENCOUNTER — Other Ambulatory Visit: Payer: Self-pay | Admitting: Obstetrics and Gynecology

## 2020-08-27 ENCOUNTER — Other Ambulatory Visit: Payer: Self-pay

## 2020-08-27 DIAGNOSIS — R922 Inconclusive mammogram: Secondary | ICD-10-CM | POA: Diagnosis not present

## 2020-08-27 DIAGNOSIS — N6012 Diffuse cystic mastopathy of left breast: Secondary | ICD-10-CM | POA: Diagnosis not present

## 2020-08-27 DIAGNOSIS — R928 Other abnormal and inconclusive findings on diagnostic imaging of breast: Secondary | ICD-10-CM

## 2020-08-27 DIAGNOSIS — N6489 Other specified disorders of breast: Secondary | ICD-10-CM

## 2021-01-18 DIAGNOSIS — D1801 Hemangioma of skin and subcutaneous tissue: Secondary | ICD-10-CM | POA: Diagnosis not present

## 2021-01-18 DIAGNOSIS — D225 Melanocytic nevi of trunk: Secondary | ICD-10-CM | POA: Diagnosis not present

## 2021-01-18 DIAGNOSIS — L905 Scar conditions and fibrosis of skin: Secondary | ICD-10-CM | POA: Diagnosis not present

## 2021-01-27 ENCOUNTER — Other Ambulatory Visit: Payer: Self-pay | Admitting: Internal Medicine

## 2021-01-27 ENCOUNTER — Ambulatory Visit
Admission: RE | Admit: 2021-01-27 | Discharge: 2021-01-27 | Disposition: A | Discharge status: Home / Self Care | Payer: BC Managed Care – PPO | Source: Ambulatory Visit | Attending: Internal Medicine | Admitting: Internal Medicine

## 2021-01-27 DIAGNOSIS — M546 Pain in thoracic spine: Secondary | ICD-10-CM | POA: Diagnosis not present

## 2021-01-27 DIAGNOSIS — M62830 Muscle spasm of back: Secondary | ICD-10-CM

## 2021-01-27 DIAGNOSIS — E559 Vitamin D deficiency, unspecified: Secondary | ICD-10-CM | POA: Diagnosis not present

## 2021-01-27 DIAGNOSIS — R42 Dizziness and giddiness: Secondary | ICD-10-CM | POA: Diagnosis not present

## 2021-01-27 DIAGNOSIS — R634 Abnormal weight loss: Secondary | ICD-10-CM | POA: Diagnosis not present

## 2021-01-27 DIAGNOSIS — E611 Iron deficiency: Secondary | ICD-10-CM | POA: Diagnosis not present

## 2021-01-31 ENCOUNTER — Other Ambulatory Visit: Payer: Self-pay | Admitting: Obstetrics and Gynecology

## 2021-01-31 DIAGNOSIS — N6002 Solitary cyst of left breast: Secondary | ICD-10-CM

## 2021-02-03 ENCOUNTER — Other Ambulatory Visit: Payer: Self-pay

## 2021-02-03 ENCOUNTER — Ambulatory Visit
Admission: RE | Admit: 2021-02-03 | Discharge: 2021-02-03 | Disposition: A | Discharge status: Home / Self Care | Payer: BC Managed Care – PPO | Source: Ambulatory Visit | Attending: Obstetrics and Gynecology | Admitting: Obstetrics and Gynecology

## 2021-02-03 DIAGNOSIS — R922 Inconclusive mammogram: Secondary | ICD-10-CM | POA: Diagnosis not present

## 2021-02-03 DIAGNOSIS — N6002 Solitary cyst of left breast: Secondary | ICD-10-CM

## 2021-07-08 DIAGNOSIS — E559 Vitamin D deficiency, unspecified: Secondary | ICD-10-CM | POA: Diagnosis not present

## 2021-07-08 DIAGNOSIS — Z13220 Encounter for screening for lipoid disorders: Secondary | ICD-10-CM | POA: Diagnosis not present

## 2021-07-08 DIAGNOSIS — Z0000 Encounter for general adult medical examination without abnormal findings: Secondary | ICD-10-CM | POA: Diagnosis not present

## 2021-09-15 DIAGNOSIS — Z01419 Encounter for gynecological examination (general) (routine) without abnormal findings: Secondary | ICD-10-CM | POA: Diagnosis not present

## 2021-09-15 DIAGNOSIS — Z681 Body mass index (BMI) 19 or less, adult: Secondary | ICD-10-CM | POA: Diagnosis not present

## 2021-09-15 DIAGNOSIS — Z124 Encounter for screening for malignant neoplasm of cervix: Secondary | ICD-10-CM | POA: Diagnosis not present

## 2021-09-15 DIAGNOSIS — Z1151 Encounter for screening for human papillomavirus (HPV): Secondary | ICD-10-CM | POA: Diagnosis not present

## 2021-12-28 ENCOUNTER — Other Ambulatory Visit: Payer: Self-pay | Admitting: Obstetrics and Gynecology

## 2021-12-28 DIAGNOSIS — R928 Other abnormal and inconclusive findings on diagnostic imaging of breast: Secondary | ICD-10-CM

## 2022-02-15 ENCOUNTER — Ambulatory Visit
Admission: RE | Admit: 2022-02-15 | Discharge: 2022-02-15 | Disposition: A | Discharge status: Home / Self Care | Payer: BC Managed Care – PPO | Source: Ambulatory Visit | Attending: Obstetrics and Gynecology | Admitting: Obstetrics and Gynecology

## 2022-02-15 ENCOUNTER — Other Ambulatory Visit: Payer: Self-pay | Admitting: Obstetrics and Gynecology

## 2022-02-15 DIAGNOSIS — R928 Other abnormal and inconclusive findings on diagnostic imaging of breast: Secondary | ICD-10-CM

## 2022-02-15 DIAGNOSIS — R922 Inconclusive mammogram: Secondary | ICD-10-CM | POA: Diagnosis not present

## 2022-02-15 DIAGNOSIS — N6489 Other specified disorders of breast: Secondary | ICD-10-CM | POA: Diagnosis not present

## 2022-10-17 DIAGNOSIS — Z1151 Encounter for screening for human papillomavirus (HPV): Secondary | ICD-10-CM | POA: Diagnosis not present

## 2022-10-17 DIAGNOSIS — Z124 Encounter for screening for malignant neoplasm of cervix: Secondary | ICD-10-CM | POA: Diagnosis not present

## 2022-10-17 DIAGNOSIS — Z6820 Body mass index (BMI) 20.0-20.9, adult: Secondary | ICD-10-CM | POA: Diagnosis not present

## 2022-10-17 DIAGNOSIS — Z01419 Encounter for gynecological examination (general) (routine) without abnormal findings: Secondary | ICD-10-CM | POA: Diagnosis not present

## 2022-11-27 ENCOUNTER — Encounter: Payer: Self-pay | Admitting: Gastroenterology

## 2022-12-14 ENCOUNTER — Ambulatory Visit (AMBULATORY_SURGERY_CENTER): Payer: BC Managed Care – PPO

## 2022-12-14 VITALS — Ht 64.0 in | Wt 122.0 lb

## 2022-12-14 DIAGNOSIS — Z1211 Encounter for screening for malignant neoplasm of colon: Secondary | ICD-10-CM

## 2022-12-14 MED ORDER — NA SULFATE-K SULFATE-MG SULF 17.5-3.13-1.6 GM/177ML PO SOLN: 1.0000 | Freq: Once | ORAL | 0 refills | Status: AC

## 2022-12-14 NOTE — Progress Notes (Signed)

## 2022-12-29 ENCOUNTER — Encounter: Payer: Self-pay | Admitting: Gastroenterology

## 2022-12-29 ENCOUNTER — Telehealth: Payer: Self-pay | Admitting: Gastroenterology

## 2022-12-29 MED ORDER — NA SULFATE-K SULFATE-MG SULF 17.5-3.13-1.6 GM/177ML PO SOLN: 1.0000 | Freq: Once | ORAL | 0 refills | Status: AC

## 2022-12-29 NOTE — Telephone Encounter (Signed)
Inbound call from patient requesting prep medication be sent to [facility] on [address] Please advise.

## 2022-12-29 NOTE — Telephone Encounter (Signed)
Called and spoke with patient- she reports she has been trying to have her prep RX transferred to [facility] but is unable to speak with anyone on the phone at the pharmacy-  Previous RX cancelled and new Rx sent to [facility] per patient request; Patient advised to call back to the office at [phone] should questions/concerns arise; Patient verbalized understanding of information/instructions;

## 2023-01-12 ENCOUNTER — Ambulatory Visit (AMBULATORY_SURGERY_CENTER): Payer: BC Managed Care – PPO | Admitting: Gastroenterology

## 2023-01-12 ENCOUNTER — Encounter: Payer: Self-pay | Admitting: Gastroenterology

## 2023-01-12 VITALS — BP 115/76 | HR 56 | Temp 99.1°F | Resp 11 | Ht 64.0 in | Wt 122.0 lb

## 2023-01-12 DIAGNOSIS — Z1211 Encounter for screening for malignant neoplasm of colon: Secondary | ICD-10-CM

## 2023-01-12 MED ORDER — SODIUM CHLORIDE 0.9 % IV SOLN: 500.0000 mL | Freq: Once | INTRAVENOUS | Status: DC

## 2023-01-12 NOTE — Op Note (Signed)
[facility] Patient Name: Deborah Mitchell Procedure Date: 01/12/2023 2:09 PM MRN: [id number] Endoscopist: Napoleon Form , MD, [id number] Age: 47 Referring MD:  Date of Birth: 19-Apr-1976 Gender: Female Account #: [id number] Procedure:                Colonoscopy Indications:              Screening for colorectal malignant neoplasm Medicines:                Monitored Anesthesia Care Procedure:                Pre-Anesthesia Assessment:                           - Prior to the procedure, a History and Physical                            was performed, and patient medications and                            allergies were reviewed. The patient's tolerance of                            previous anesthesia was also reviewed. The risks                            and benefits of the procedure and the sedation                            options and risks were discussed with the patient.                            All questions were answered, and informed consent                            was obtained. Prior Anticoagulants: The patient has                            taken no anticoagulant or antiplatelet agents. ASA                            Grade Assessment: I - A normal, healthy patient.                            After reviewing the risks and benefits, the patient                            was deemed in satisfactory condition to undergo the                            procedure.                           After obtaining informed consent, the colonoscope  was passed under direct vision. Throughout the                            procedure, the patient's blood pressure, pulse, and                            oxygen saturations were monitored continuously. The                            Olympus Scope SN: [id number] was introduced through                            the anus and advanced to the the cecum, identified                            by appendiceal orifice  and ileocecal valve. The                            colonoscopy was performed without difficulty. The                            patient tolerated the procedure well. The quality                            of the bowel preparation was good. The ileocecal                            valve, appendiceal orifice, and rectum were                            photographed. Scope In: 2:20:09 PM Scope Out: 2:37:06 PM Scope Withdrawal Time: 0 hours 9 minutes 21 seconds  Total Procedure Duration: 0 hours 16 minutes 57 seconds  Findings:                 The perianal and digital rectal examinations were                            normal.                           A few small-mouthed diverticula were found in the                            sigmoid colon.                           Non-bleeding external and internal hemorrhoids were                            found during retroflexion. The hemorrhoids were                            small. Complications:            No immediate complications. Estimated Blood Loss:  Estimated blood loss: none. Impression:               - Diverticulosis in the sigmoid colon.                           - Non-bleeding external and internal hemorrhoids.                           - No specimens collected. Recommendation:           - Patient has a contact number available for                            emergencies. The signs and symptoms of potential                            delayed complications were discussed with the                            patient. Return to normal activities tomorrow.                            Written discharge instructions were provided to the                            patient.                           - Resume previous diet.                           - Continue present medications.                           - Repeat colonoscopy in 10 years for surveillance. Napoleon Form, MD 01/12/2023 2:47:41 PM This report has been signed  electronically.

## 2023-01-12 NOTE — Progress Notes (Signed)
Report to PACU, RN, vss, BBS= Clear.  

## 2023-01-12 NOTE — Progress Notes (Unsigned)
[facility] History and Physical   Primary Care Physician:  Zelphia Cairo, MD   Reason for Procedure:  Colorectal cancer screening  Plan:    Screening colonoscopy with possible interventions as needed     HPI: Deborah Mitchell is a very pleasant 47 y.o. female here for screening colonoscopy. Denies any nausea, vomiting, abdominal pain, melena or bright red blood per rectum  The risks and benefits as well as alternatives of endoscopic procedure(s) have been discussed and reviewed. All questions answered. The patient agrees to proceed.    Past Medical History:  Diagnosis Date   Headache     Past Surgical History:  Procedure Laterality Date   CESAREAN SECTION  2010,2011   x2   CESAREAN SECTION  12/26/2011   Procedure: CESAREAN SECTION;  Surgeon: Zelphia Cairo, MD;  Location: [facility];  Service: Obstetrics;  Laterality: N/A;  repeat edc 01/01/12    Prior to Admission medications   Medication Sig Start Date End Date Taking? Authorizing Provider  Multiple Vitamins-Minerals (WOMENS MULTIVITAMIN PO) Take 1 tablet by mouth 2 (two) times daily.   Yes [provider]  Omega-3 Fatty Acids (FISH OIL PO) Take by mouth.    [provider]  Turmeric (QC TUMERIC COMPLEX PO) Take by mouth.    [provider]    Current Outpatient Medications  Medication Sig Dispense Refill   Multiple Vitamins-Minerals (WOMENS MULTIVITAMIN PO) Take 1 tablet by mouth 2 (two) times daily.     Omega-3 Fatty Acids (FISH OIL PO) Take by mouth.     Turmeric (QC TUMERIC COMPLEX PO) Take by mouth.     Current Facility-Administered Medications  Medication Dose Route Frequency Provider Last Rate Last Admin   0.9 %  sodium chloride infusion  500 mL Intravenous Once Napoleon Form, MD        Allergies as of 01/12/2023   (No Known Allergies)    Family History  Problem Relation Age of Onset   Healthy Mother    Hypertension Father    Hyperlipidemia Father     Alzheimer's disease Paternal Grandfather    Colon cancer Neg Hx    Colon polyps Neg Hx    Esophageal cancer Neg Hx    Rectal cancer Neg Hx    Stomach cancer Neg Hx     Social History   Socioeconomic History   Marital status: Married    Spouse name: Not on file   Number of children: 3   Years of education: 16   Highest education level: Master's degree (e.g., MA, MS, MEng, MEd, MSW, MBA)  Occupational History   Occupation: Homemaker  Tobacco Use   Smoking status: Former    Types: Cigarettes   Smokeless tobacco: Never  Vaping Use   Vaping status: Never Used  Substance and Sexual Activity   Alcohol use: Yes    Comment: Social   Drug use: No   Sexual activity: Not on file  Other Topics Concern   Not on file  Social History Narrative   Lives at home with husband and three children.   Left-handed.   0.5 cup caffeine daily.   Social Determinants of Health   Financial Resource Strain: Not on file  Food Insecurity: Not on file  Transportation Needs: Not on file  Physical Activity: Not on file  Stress: Not on file  Social Connections: Not on file  Intimate Partner Violence: Not on file    Review of Systems:  All other review of systems negative except  as mentioned in the HPI.  Physical Exam: Vital signs in last 24 hours: BP (!) 140/79   Pulse (!) 59   Temp 99.1 F (37.3 C)   Ht 5\' 4"  (1.626 m)   Wt 122 lb (55.3 kg)   LMP 01/12/2023 (Exact Date) Comment: menstrual period started today  SpO2 100%   BMI 20.94 kg/m  General:   Alert, NAD Lungs:  Clear .   Heart:  Regular rate and rhythm Abdomen:  Soft, nontender and nondistended. Neuro/Psych:  Alert and cooperative. Normal mood and affect. A and O x 3  Reviewed labs, radiology imaging, old records and pertinent past GI work up  Patient is appropriate for planned procedure(s) and anesthesia in an ambulatory setting   K. Scherry Ran , MD [phone]

## 2023-01-12 NOTE — Progress Notes (Signed)
Pt's states no medical or surgical changes since previsit or office visit. 

## 2023-01-12 NOTE — Patient Instructions (Signed)

## 2023-01-15 ENCOUNTER — Telehealth: Payer: Self-pay | Admitting: *Deleted

## 2023-01-15 NOTE — Telephone Encounter (Signed)
  Follow up Call-     01/12/2023    1:53 PM  Call back number  Post procedure Call Back phone  # [phone]  Permission to leave phone message Yes     Patient questions:  Do you have a fever, pain , or abdominal swelling? No. Pain Score  0 *  Have you tolerated food without any problems? Yes.    Have you been able to return to your normal activities? Yes.    Do you have any questions about your discharge instructions: Diet   No. Medications  No. Follow up visit  No.  Do you have questions or concerns about your Care? No.  Actions: * If pain score is 4 or above: No action needed, pain <4.

## 2023-03-07 DIAGNOSIS — Z1231 Encounter for screening mammogram for malignant neoplasm of breast: Secondary | ICD-10-CM | POA: Diagnosis not present

## 2023-03-26 IMAGING — MG DIGITAL DIAGNOSTIC BILAT W/ TOMO W/ CAD
8 of 14 series · 8 of 40 positions shown · non-contrast
Comparison: Previous exam(s).

CLINICAL DATA: Follow-up probably benign hypoechoic area in the 5
o'clock position of the left breast.

EXAM:
DIGITAL DIAGNOSTIC BILATERAL MAMMOGRAM WITH TOMOSYNTHESIS AND CAD;
ULTRASOUND LEFT BREAST LIMITED
TECHNIQUE: Bilateral digital diagnostic mammography and breast tomosynthesis
was performed. The images were evaluated with computer-aided
detection.; Targeted ultrasound examination of the left breast was
performed.

[L MLO synth-2D]
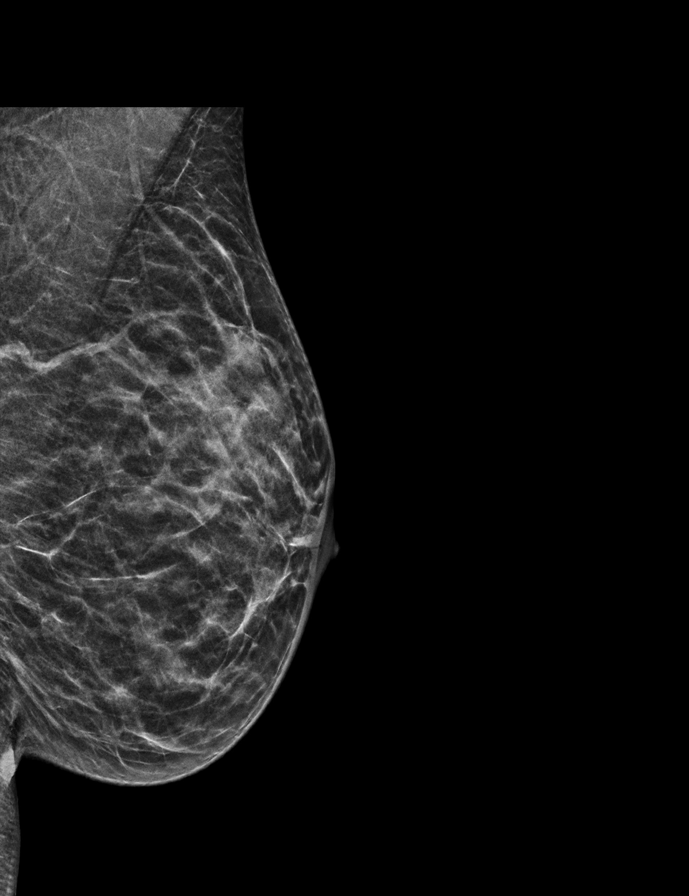

[R CC synth-2D]
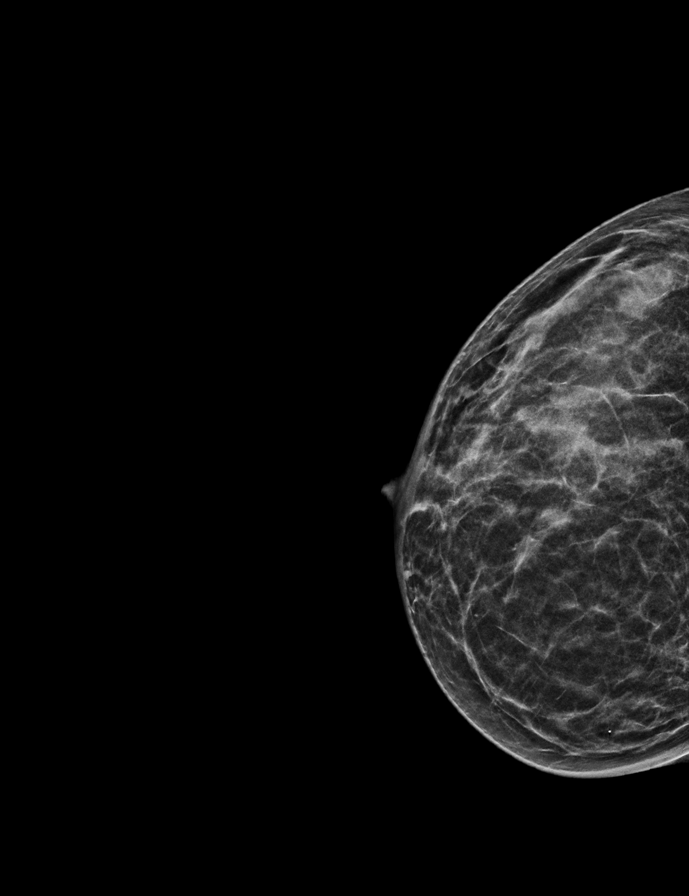

[L ML synth-2D]
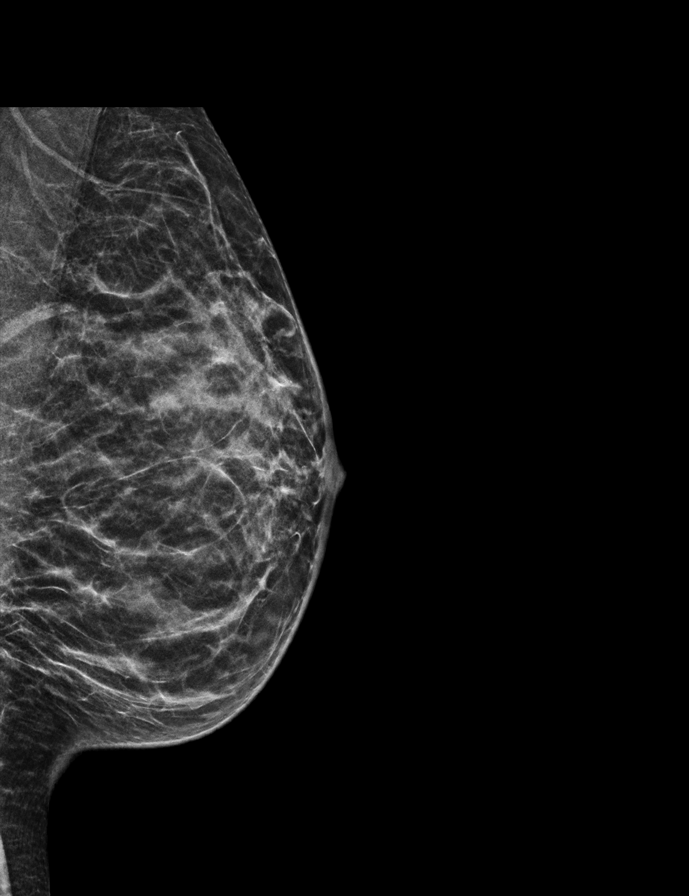

[R MLO synth-2D]
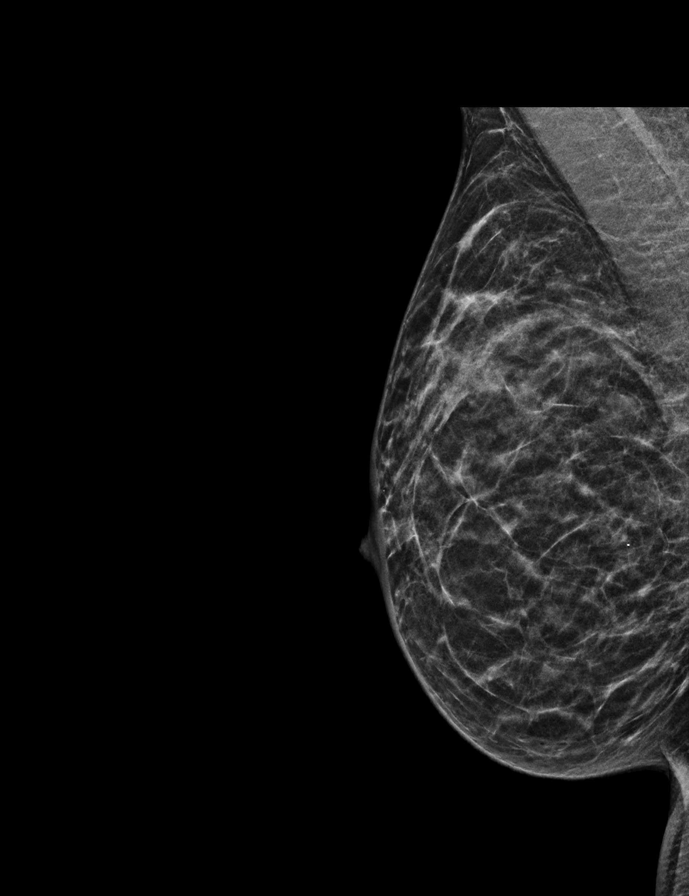

[L CC synth-2D (1 of 3)]
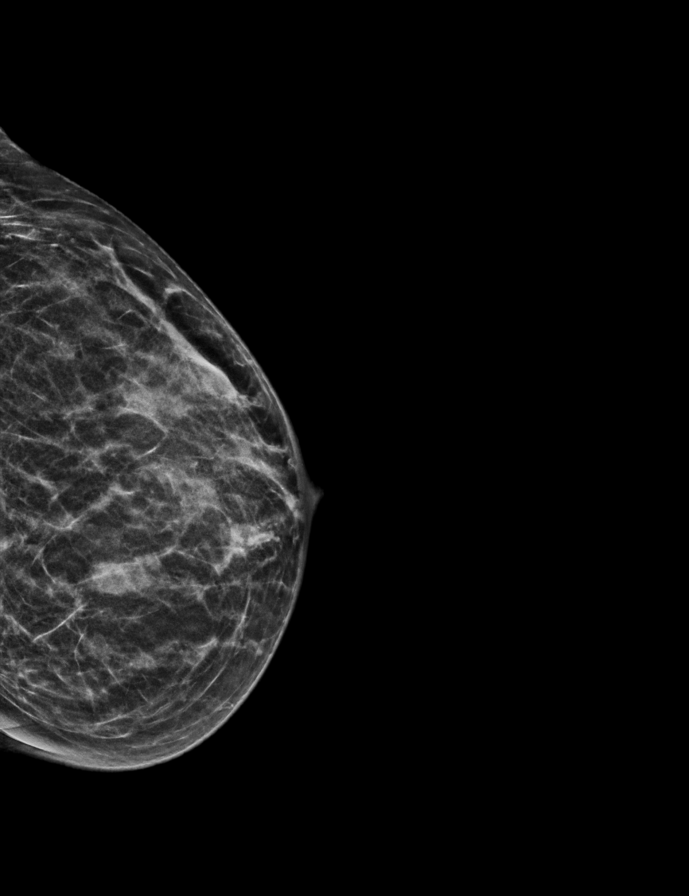

[L CC synth-2D (2 of 3)]
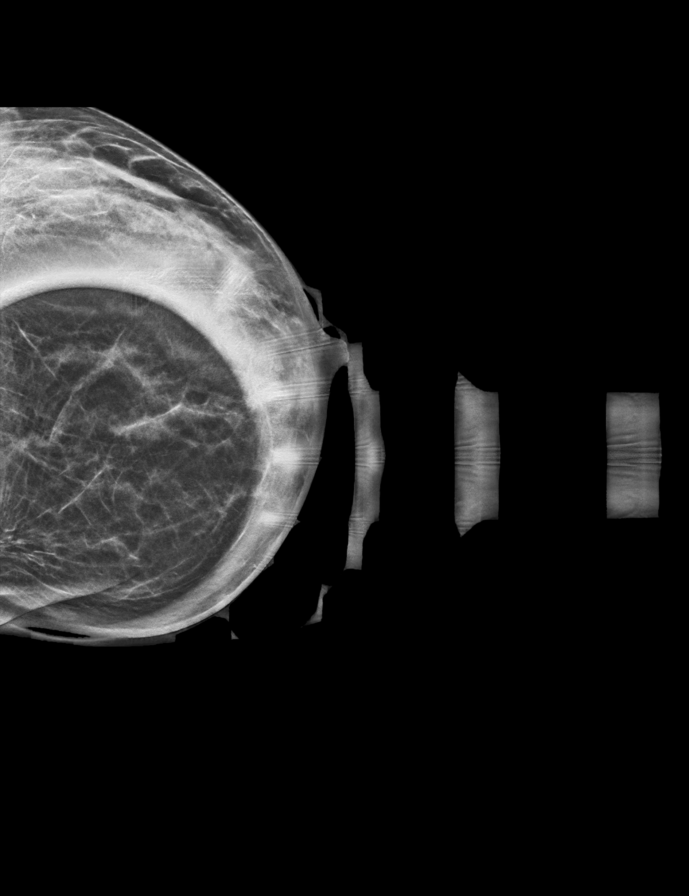

[L CC synth-2D (3 of 3)]
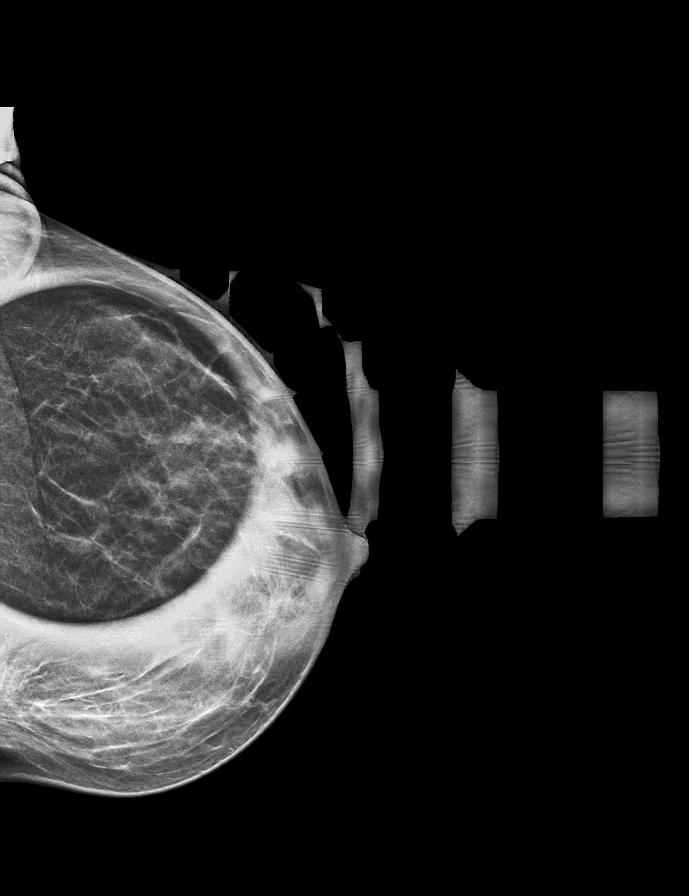

[R CC tomo · tomo slice 18/35.0]
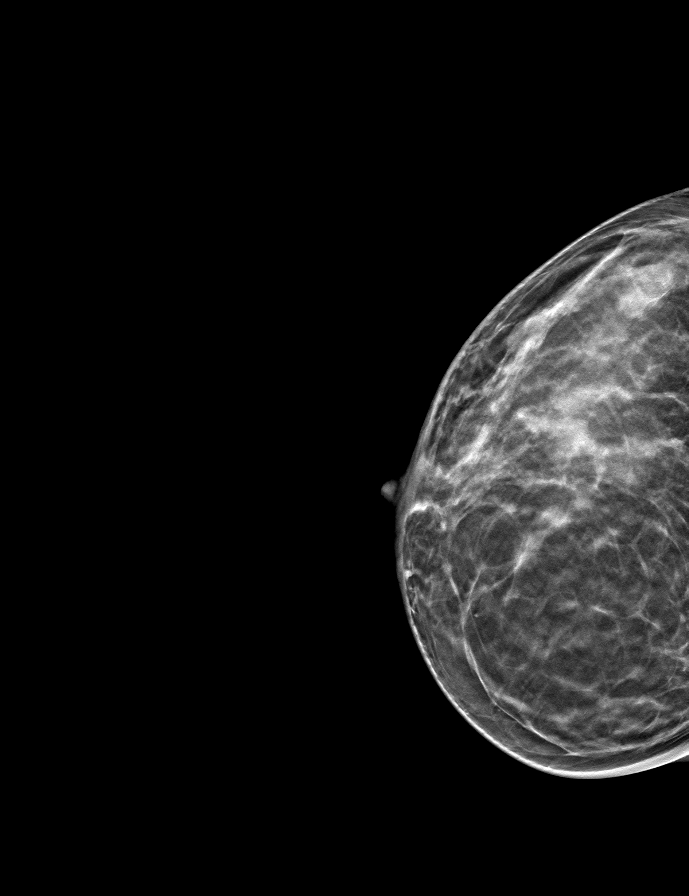

[8 of 40 positions shown; findings below may reference images not displayed]

ACR Breast Density Category d: The breast tissue is extremely dense,
which lowers the sensitivity of mammography.
FINDINGS: Stable mammographic appearance of the breasts with no interval
findings suspicious for malignancy in either breast. No mass or
asymmetry is seen in either breast.

Targeted ultrasound is performed, showing an irregularly shaped
hypoechoic area within an oval area of glandular tissue in the 5
o'clock position of the left breast, 3 cm from the nipple. The
hypoechoic area measures 7 x 6 x 4 mm, representing no significant
change since 08/27/2020 and 02/11/2020. No interval findings
suspicious for malignancy.
IMPRESSION: 1. No significant change in size of the previously described
probably benign hypoechoic area in the 5 o'clock position of the
left breast. Based on the appearance today, this may represent a
small hamartoma.
2. No interval findings suspicious for malignancy in either breast.

RECOMMENDATION:
Bilateral diagnostic mammogram and left breast ultrasound in 1 year
to complete 2 years of follow-up of the probably benign hypoechoic
area in the left breast.

I have discussed the findings and recommendations with the patient.
If applicable, a reminder letter will be sent to the patient
regarding the next appointment.

BI-RADS CATEGORY  3: Probably benign.

## 2023-07-23 ENCOUNTER — Other Ambulatory Visit: Payer: Self-pay

## 2023-07-23 ENCOUNTER — Emergency Department (HOSPITAL_COMMUNITY)
Admission: EM | Admit: 2023-07-23 | Discharge: 2023-07-23 | Disposition: A | Discharge status: Home / Self Care | Payer: Self-pay | Attending: Emergency Medicine | Admitting: Emergency Medicine

## 2023-07-23 ENCOUNTER — Encounter (HOSPITAL_COMMUNITY): Payer: Self-pay | Admitting: *Deleted

## 2023-07-23 DIAGNOSIS — R42 Dizziness and giddiness: Secondary | ICD-10-CM | POA: Diagnosis not present

## 2023-07-23 DIAGNOSIS — R110 Nausea: Secondary | ICD-10-CM | POA: Diagnosis not present

## 2023-07-23 DIAGNOSIS — R001 Bradycardia, unspecified: Secondary | ICD-10-CM | POA: Diagnosis not present

## 2023-07-23 DIAGNOSIS — R000 Tachycardia, unspecified: Secondary | ICD-10-CM | POA: Diagnosis not present

## 2023-07-23 LAB — BASIC METABOLIC PANEL
Anion gap: 11 (ref 5–15)
BUN: 13 mg/dL (ref 6–20)
CO2: 19 mmol/L — ABNORMAL LOW (ref 22–32)
Calcium: 9.1 mg/dL (ref 8.9–10.3)
Chloride: 106 mmol/L (ref 98–111)
Creatinine, Ser: 0.65 mg/dL (ref 0.44–1.00)
GFR, Estimated: >60 mL/min (ref >60)
Glucose, Bld: 139 mg/dL — ABNORMAL HIGH (ref 70–99)
Potassium: 3.5 mmol/L (ref 3.5–5.1)
Sodium: 136 mmol/L (ref 135–145)

## 2023-07-23 LAB — CBC WITH DIFFERENTIAL/PLATELET
Abs Immature Granulocytes: 0.01 10*3/uL (ref 0.00–0.07)
Basophils Absolute: 0 10*3/uL (ref 0.0–0.1)
Basophils Relative: 0 %
Eosinophils Absolute: 0.1 10*3/uL (ref 0.0–0.5)
Eosinophils Relative: 2 %
HCT: 36.9 % (ref 36.0–46.0)
Hemoglobin: 12.3 g/dL (ref 12.0–15.0)
Immature Granulocytes: 0 %
Lymphocytes Relative: 26 %
Lymphs Abs: 1.3 10*3/uL (ref 0.7–4.0)
MCH: 30.4 pg (ref 26.0–34.0)
MCHC: 33.3 g/dL (ref 30.0–36.0)
MCV: 91.1 fL (ref 80.0–100.0)
Monocytes Absolute: 0.3 10*3/uL (ref 0.1–1.0)
Monocytes Relative: 5 %
Neutro Abs: 3.4 10*3/uL (ref 1.7–7.7)
Neutrophils Relative %: 67 %
Platelets: 205 10*3/uL (ref 150–400)
RBC: 4.05 MIL/uL (ref 3.87–5.11)
RDW: 12.3 % (ref 11.5–15.5)
WBC: 5.2 10*3/uL (ref 4.0–10.5)
nRBC: 0 % (ref 0.0–0.2)

## 2023-07-23 LAB — HCG, SERUM, QUALITATIVE: Preg, Serum: NEGATIVE

## 2023-07-23 MED ORDER — SODIUM CHLORIDE 0.9 % IV BOLUS
1000.0000 mL | Freq: Once | INTRAVENOUS | Status: AC
Start: 2023-07-23 — End: 2023-07-23
  Administered 2023-07-23: 1000 mL via INTRAVENOUS

## 2023-07-23 MED ORDER — ONDANSETRON HCL 4 MG/2ML IJ SOLN
4.0000 mg | Freq: Once | INTRAMUSCULAR | Status: AC
  Administered 2023-07-23: 4 mg via INTRAVENOUS
  Filled 2023-07-23: qty 2

## 2023-07-23 MED ORDER — ONDANSETRON 4 MG PO TBDP: 4.0000 mg | ORAL_TABLET | Freq: Three times a day (TID) | ORAL | 0 refills | Status: AC | PRN

## 2023-07-23 NOTE — ED Notes (Signed)
 Pt ambulated to restroom without incident.

## 2023-07-23 NOTE — ED Notes (Signed)
 Pt ambulated to restroom with assistance from secondary rn

## 2023-07-23 NOTE — ED Provider Notes (Addendum)
 [place] [facility] AT [facility] Provider Note   CSN: [id number] Arrival date & time: 07/23/23  9562     History  Chief Complaint  Patient presents with   Dizziness    Deborah Mitchell is a 48 y.o. female.  Patient with acute onset that at about 820 this morning of dizziness no real room spinning.  Has not vomited maybe some questionable nausea associated with it before.  Patient felt little dizzy before hand but then got more intense when she was trying to drive her daughter to the doctor.  Temp here was 97.7 pulse 106 respirations 18 blood pressure 120/80 oxygen saturation is 100% on room air.  Past medical history noncontributory.  Patient denies headache abdominal pain vomiting or diarrhea.  Denies any upper respiratory symptoms  In addition patient now states that she felt as if there was some palpitations.  Patient's initial heart rate was around 106 when she got here.  EKG done did not show any significant arrhythmias.       Home Medications Prior to Admission medications   Medication Sig Start Date End Date Taking? Authorizing Provider  Multiple Vitamins-Minerals (WOMENS MULTIVITAMIN PO) Take 1 tablet by mouth 2 (two) times daily.    [provider]  Omega-3 Fatty Acids (FISH OIL PO) Take by mouth.    [provider]  Turmeric (QC TUMERIC COMPLEX PO) Take by mouth.    [provider]      Allergies    Patient has no known allergies.    Review of Systems   Review of Systems  Constitutional:  Negative for chills and fever.  HENT:  Negative for ear pain and sore throat.   Eyes:  Negative for pain and visual disturbance.  Respiratory:  Negative for cough and shortness of breath.   Cardiovascular:  Positive for palpitations. Negative for chest pain.  Gastrointestinal:  Positive for nausea. Negative for abdominal pain and vomiting.  Genitourinary:  Negative for dysuria and hematuria.  Musculoskeletal:  Negative for  arthralgias and back pain.  Skin:  Negative for color change and rash.  Neurological:  Positive for dizziness. Negative for seizures and syncope.  All other systems reviewed and are negative.   Physical Exam Updated Vital Signs BP 120/80 (BP Location: Right Arm)   Pulse (!) 106   Temp 97.7 F (36.5 C) (Oral)   Resp 18   Ht 1.626 m (5\' 4" )   Wt 55.3 kg   SpO2 100%   BMI 20.94 kg/m  Physical Exam Vitals and nursing note reviewed.  Constitutional:      General: She is not in acute distress.    Appearance: Normal appearance. She is well-developed.  HENT:     Head: Normocephalic and atraumatic.  Eyes:     Extraocular Movements: Extraocular movements intact.     Conjunctiva/sclera: Conjunctivae normal.     Pupils: Pupils are equal, round, and reactive to light.  Cardiovascular:     Rate and Rhythm: Normal rate and regular rhythm.     Heart sounds: No murmur heard. Pulmonary:     Effort: Pulmonary effort is normal. No respiratory distress.     Breath sounds: Normal breath sounds.  Abdominal:     Palpations: Abdomen is soft.     Tenderness: There is no abdominal tenderness.  Musculoskeletal:        General: No swelling.     Cervical back: Normal range of motion and neck supple. No rigidity.  Skin:  General: Skin is warm and dry.     Capillary Refill: Capillary refill takes less than 2 seconds.  Neurological:     General: No focal deficit present.     Mental Status: She is alert and oriented to person, place, and time.     Cranial Nerves: No cranial nerve deficit.     Sensory: No sensory deficit.     Motor: No weakness.  Psychiatric:        Mood and Affect: Mood normal.     ED Results / Procedures / Treatments   Labs (all labs ordered are listed, but only abnormal results are displayed) Labs Reviewed  BASIC METABOLIC PANEL - Abnormal; Notable for the following components:      Result Value   CO2 19 (*)    Glucose, Bld 139 (*)    All other components within  normal limits  CBC WITH DIFFERENTIAL/PLATELET  HCG, SERUM, QUALITATIVE    EKG EKG Interpretation Date/Time:  Monday July 23 2023 11:26:26 EDT Ventricular Rate:  72 PR Interval:  160 QRS Duration:  92 QT Interval:  410 QTC Calculation: 448 R Axis:   63  Text Interpretation: Normal sinus rhythm Incomplete right bundle branch block Borderline ECG No previous ECGs available Confirmed by Vanetta Mulders [phone]) on 07/23/2023 11:36:58 AM  Radiology No results found.  Procedures Procedures    Medications Ordered in ED Medications  ondansetron (ZOFRAN) injection 4 mg (4 mg Intravenous Given 07/23/23 1017)  sodium chloride 0.9 % bolus 1,000 mL (0 mLs Intravenous Stopped 07/23/23 1117)    ED Course/ Medical Decision Making/ A&P                                 Medical Decision Making Amount and/or Complexity of Data Reviewed Labs: ordered.  Risk Prescription drug management.   Patient symptoms do not seem to be true vertigo.  Possibly could be nauseated related.  Will give IV fluids check labs rule out pregnancy and give Zofran.  May add on Antivert as well.  But will hold on giving the Antivert for now.  Patient was significant improvement after liter of fluid and Zofran.  Patient ambulatory to the bathroom says all symptoms have resolved.  She still feels a little bit of generalized weakness.  But no room spinning says dizziness is essentially resolved.  Patient metabolic panel significant for a CO2 19 glucose 139 renal function normal. CBC white count 5.2 hemoglobin 12.3 platelets 205.  Pregnancy test negative.  EKG without any acute findings.  Will reassess patient but I think with improvement with just the Zofran I think it is unlikely that this is secondary to stroke plus there was no true room spinning or vertigo component.  Will probably treat with Zofran ODT and have her follow-up with her primary care doctor.  Have her return for any new or worse symptoms.  We were  not able to capture that feeling of the palpitations.  So not sure what was going on with that.  I think patient safe for discharge home with precautions.  Still feel that this was predominantly dizziness with nausea.  Patient will return for any new or worse symptoms.  If the nausea or the dizziness sort of comes back when the Zofran wears off that makes sense that this would be a virus.  Final Clinical Impression(s) / ED Diagnoses Final diagnoses:  Dizziness  Nausea    Rx / DC  Orders ED Discharge Orders     None         Vanetta Mulders, MD 07/23/23 1012    Vanetta Mulders, MD 07/23/23 1212    Vanetta Mulders, MD 07/23/23 [phone]

## 2023-07-23 NOTE — ED Triage Notes (Signed)
 States she was driving her daughter to the doctor and had a sudden onset of dizziness . States she felt a gradual onset and got worse.

## 2023-07-23 NOTE — Discharge Instructions (Addendum)
 Return for any new or worse symptoms.  Use the Zofran ODT for the nausea or even some of the dizziness.  If you get room spinning or any other neurological type symptoms need to get seen right away.  Suspect this may have been predominantly a nausea driven dizziness episode.

## 2023-08-01 DIAGNOSIS — R42 Dizziness and giddiness: Secondary | ICD-10-CM | POA: Diagnosis not present

## 2023-08-01 DIAGNOSIS — R000 Tachycardia, unspecified: Secondary | ICD-10-CM | POA: Diagnosis not present

## 2023-11-14 DIAGNOSIS — Z124 Encounter for screening for malignant neoplasm of cervix: Secondary | ICD-10-CM | POA: Diagnosis not present

## 2023-11-14 DIAGNOSIS — Z1151 Encounter for screening for human papillomavirus (HPV): Secondary | ICD-10-CM | POA: Diagnosis not present

## 2023-11-14 DIAGNOSIS — Z01419 Encounter for gynecological examination (general) (routine) without abnormal findings: Secondary | ICD-10-CM | POA: Diagnosis not present

## 2023-11-14 DIAGNOSIS — Z6821 Body mass index (BMI) 21.0-21.9, adult: Secondary | ICD-10-CM | POA: Diagnosis not present

## 2024-02-04 DIAGNOSIS — Z23 Encounter for immunization: Secondary | ICD-10-CM | POA: Diagnosis not present

## 2024-02-04 DIAGNOSIS — Z13220 Encounter for screening for lipoid disorders: Secondary | ICD-10-CM | POA: Diagnosis not present

## 2024-02-04 DIAGNOSIS — Z0000 Encounter for general adult medical examination without abnormal findings: Secondary | ICD-10-CM | POA: Diagnosis not present

## 2024-03-14 DIAGNOSIS — Z1231 Encounter for screening mammogram for malignant neoplasm of breast: Secondary | ICD-10-CM | POA: Diagnosis not present

## 2024-03-19 ENCOUNTER — Other Ambulatory Visit: Payer: Self-pay | Admitting: Obstetrics and Gynecology

## 2024-03-19 DIAGNOSIS — R928 Other abnormal and inconclusive findings on diagnostic imaging of breast: Secondary | ICD-10-CM

## 2024-03-22 ENCOUNTER — Other Ambulatory Visit

## 2024-03-22 ENCOUNTER — Ambulatory Visit
Admission: RE | Admit: 2024-03-22 | Discharge: 2024-03-22 | Disposition: A | Discharge status: Home / Self Care | Source: Ambulatory Visit | Attending: Obstetrics and Gynecology | Admitting: Obstetrics and Gynecology

## 2024-03-22 ENCOUNTER — Encounter

## 2024-03-22 ENCOUNTER — Ambulatory Visit
Admission: RE | Admit: 2024-03-22 | Discharge: 2024-03-22 | Disposition: A | Source: Ambulatory Visit | Attending: Obstetrics and Gynecology | Admitting: Obstetrics and Gynecology

## 2024-03-22 DIAGNOSIS — R928 Other abnormal and inconclusive findings on diagnostic imaging of breast: Secondary | ICD-10-CM

## 2024-03-22 DIAGNOSIS — N6315 Unspecified lump in the right breast, overlapping quadrants: Secondary | ICD-10-CM | POA: Diagnosis not present
